# Patient Record
Sex: Male | Born: 1995 | Race: Black or African American | Hispanic: No | Marital: Single | Smoking: Former smoker
Health system: Southern US, Community
[De-identification: ages and names within clinical notes are randomized; demographics above are authoritative.]

## PROBLEM LIST (undated history)

## (undated) DIAGNOSIS — W3400XA Accidental discharge from unspecified firearms or gun, initial encounter: Secondary | ICD-10-CM

## (undated) HISTORY — PX: FOOT SURGERY: SHX648

---
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
## 2013-12-01 ENCOUNTER — Encounter (HOSPITAL_COMMUNITY): Payer: Self-pay | Admitting: Emergency Medicine

## 2013-12-01 ENCOUNTER — Emergency Department (HOSPITAL_COMMUNITY)
Admission: EM | Admit: 2013-12-01 | Discharge: 2013-12-01 | Disposition: A | Discharge status: Home / Self Care | Payer: Medicaid Other | Attending: Emergency Medicine | Admitting: Emergency Medicine

## 2013-12-01 DIAGNOSIS — Z87891 Personal history of nicotine dependence: Secondary | ICD-10-CM | POA: Insufficient documentation

## 2013-12-01 DIAGNOSIS — Z202 Contact with and (suspected) exposure to infections with a predominantly sexual mode of transmission: Secondary | ICD-10-CM | POA: Insufficient documentation

## 2013-12-01 DIAGNOSIS — Z880 Allergy status to penicillin: Secondary | ICD-10-CM | POA: Insufficient documentation

## 2013-12-01 HISTORY — DX: Accidental discharge from unspecified firearms or gun, initial encounter: W34.00XA

## 2013-12-01 MED ORDER — AZITHROMYCIN 1 G PO PACK
1.0000 g | PACK | Freq: Once | ORAL | Status: AC
  Administered 2013-12-01: 1 g via ORAL
  Filled 2013-12-01: qty 1

## 2013-12-01 MED ORDER — CEFTRIAXONE SODIUM 250 MG IJ SOLR
250.0000 mg | Freq: Once | INTRAMUSCULAR | Status: AC
  Administered 2013-12-01: 250 mg via INTRAMUSCULAR
  Filled 2013-12-01: qty 250

## 2013-12-01 MED ORDER — LIDOCAINE HCL (PF) 1 % IJ SOLN
INTRAMUSCULAR | Status: AC
  Filled 2013-12-01: qty 5
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
## 2016-02-20 ENCOUNTER — Encounter (HOSPITAL_COMMUNITY): Payer: Self-pay | Admitting: Emergency Medicine

## 2016-02-20 ENCOUNTER — Emergency Department (HOSPITAL_COMMUNITY)
Admission: EM | Admit: 2016-02-20 | Discharge: 2016-02-20 | Disposition: A | Discharge status: Home / Self Care | Payer: Medicaid Other | Attending: Emergency Medicine | Admitting: Emergency Medicine

## 2016-02-20 DIAGNOSIS — R21 Rash and other nonspecific skin eruption: Secondary | ICD-10-CM

## 2016-02-20 DIAGNOSIS — Z87891 Personal history of nicotine dependence: Secondary | ICD-10-CM | POA: Insufficient documentation

## 2016-02-20 LAB — URINALYSIS, ROUTINE W REFLEX MICROSCOPIC
BILIRUBIN URINE: NEGATIVE
Glucose, UA: NEGATIVE mg/dL
Hgb urine dipstick: NEGATIVE
KETONES UR: NEGATIVE mg/dL
Leukocytes, UA: NEGATIVE
NITRITE: NEGATIVE
Protein, ur: NEGATIVE mg/dL
Specific Gravity, Urine: 1.024 (ref 1.005–1.030)
pH: 7.5 (ref 5.0–8.0)

## 2016-02-20 NOTE — Discharge Instructions (Signed)
Read the information below.   Your urine did not show signs of infection. The rest of your labs will take a few days to get back. You will be called if there is anything abnormal.  Use the prescribed medication as directed.  Please discuss all new medications with your pharmacist.   It is important that [facility]  You may return to the Emergency Department at any time for worsening condition or any new symptoms that concern you. Return to ED immediately if your symptoms worsen develop fever, burning with urination, blood in urine, abdominal pain, vomiting, testicular pain, discharge from your penis.

## 2016-02-20 NOTE — ED Notes (Signed)
Pt st's he noticed a "bump" on his penis this am.
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
## 2016-02-21 LAB — RPR: RPR Ser Ql: NONREACTIVE

## 2016-02-21 LAB — GC/CHLAMYDIA PROBE AMP (~~LOC~~) NOT AT ARMC
CHLAMYDIA, DNA PROBE: NEGATIVE
Neisseria Gonorrhea: NEGATIVE

## 2016-02-21 LAB — HIV ANTIBODY (ROUTINE TESTING W REFLEX): HIV Screen 4th Generation wRfx: NONREACTIVE
